# Patient Record
Sex: Female | Born: 1955 | Race: Black or African American | Hispanic: No | Marital: Single | Smoking: Never smoker
Health system: Southern US, Community
[De-identification: ages and names within clinical notes are randomized; demographics above are authoritative.]

## PROBLEM LIST (undated history)

## (undated) DIAGNOSIS — E119 Type 2 diabetes mellitus without complications: Secondary | ICD-10-CM

## (undated) DIAGNOSIS — I639 Cerebral infarction, unspecified: Secondary | ICD-10-CM

## (undated) DIAGNOSIS — I10 Essential (primary) hypertension: Secondary | ICD-10-CM

## (undated) HISTORY — PX: LAPAROSCOPIC ROUX-EN-Y GASTRIC BYPASS WITH UPPER ENDOSCOPY AND REMOVAL OF LAP BAND: SHX6505

---

## 2017-12-23 ENCOUNTER — Emergency Department (HOSPITAL_COMMUNITY)
Admission: EM | Admit: 2017-12-23 | Discharge: 2017-12-23 | Disposition: A | Discharge status: Home / Self Care | Payer: Medicare Other | Attending: Emergency Medicine | Admitting: Emergency Medicine

## 2017-12-23 ENCOUNTER — Emergency Department (HOSPITAL_COMMUNITY): Payer: Medicare Other

## 2017-12-23 ENCOUNTER — Other Ambulatory Visit: Payer: Self-pay

## 2017-12-23 ENCOUNTER — Encounter (HOSPITAL_COMMUNITY): Payer: Self-pay | Admitting: Emergency Medicine

## 2017-12-23 DIAGNOSIS — R531 Weakness: Secondary | ICD-10-CM

## 2017-12-23 DIAGNOSIS — E119 Type 2 diabetes mellitus without complications: Secondary | ICD-10-CM | POA: Diagnosis not present

## 2017-12-23 DIAGNOSIS — R630 Anorexia: Secondary | ICD-10-CM | POA: Diagnosis not present

## 2017-12-23 DIAGNOSIS — R1114 Bilious vomiting: Secondary | ICD-10-CM | POA: Insufficient documentation

## 2017-12-23 DIAGNOSIS — R51 Headache: Secondary | ICD-10-CM | POA: Diagnosis not present

## 2017-12-23 DIAGNOSIS — Z8673 Personal history of transient ischemic attack (TIA), and cerebral infarction without residual deficits: Secondary | ICD-10-CM | POA: Diagnosis not present

## 2017-12-23 DIAGNOSIS — I10 Essential (primary) hypertension: Secondary | ICD-10-CM | POA: Diagnosis not present

## 2017-12-23 DIAGNOSIS — K219 Gastro-esophageal reflux disease without esophagitis: Secondary | ICD-10-CM | POA: Insufficient documentation

## 2017-12-23 DIAGNOSIS — R410 Disorientation, unspecified: Secondary | ICD-10-CM | POA: Diagnosis not present

## 2017-12-23 HISTORY — DX: Cerebral infarction, unspecified: I63.9

## 2017-12-23 HISTORY — DX: Essential (primary) hypertension: I10

## 2017-12-23 HISTORY — DX: Type 2 diabetes mellitus without complications: E11.9

## 2017-12-23 LAB — URINALYSIS, ROUTINE W REFLEX MICROSCOPIC
BILIRUBIN URINE: NEGATIVE
Glucose, UA: NEGATIVE mg/dL
Hgb urine dipstick: NEGATIVE
Ketones, ur: NEGATIVE mg/dL
NITRITE: NEGATIVE
PH: 6 (ref 5.0–8.0)
Protein, ur: NEGATIVE mg/dL
SPECIFIC GRAVITY, URINE: 1.02 (ref 1.005–1.030)

## 2017-12-23 LAB — COMPREHENSIVE METABOLIC PANEL
ALBUMIN: 3.6 g/dL (ref 3.5–5.0)
ALT: 10 U/L (ref 0–44)
ANION GAP: 6 (ref 5–15)
AST: 21 U/L (ref 15–41)
Alkaline Phosphatase: 76 U/L (ref 38–126)
BUN: 13 mg/dL (ref 8–23)
CHLORIDE: 109 mmol/L (ref 98–111)
CO2: 24 mmol/L (ref 22–32)
Calcium: 9.4 mg/dL (ref 8.9–10.3)
Creatinine, Ser: 1.08 mg/dL — ABNORMAL HIGH (ref 0.44–1.00)
GFR calc Af Amer: >60 mL/min (ref >60)
GFR calc non Af Amer: 54 mL/min — ABNORMAL LOW (ref >60)
Glucose, Bld: 108 mg/dL — ABNORMAL HIGH (ref 70–99)
POTASSIUM: 3.5 mmol/L (ref 3.5–5.1)
SODIUM: 139 mmol/L (ref 135–145)
Total Bilirubin: 0.8 mg/dL (ref 0.3–1.2)
Total Protein: 6.9 g/dL (ref 6.5–8.1)

## 2017-12-23 LAB — I-STAT CHEM 8, ED
BUN: 13 mg/dL (ref 8–23)
CALCIUM ION: 1.18 mmol/L (ref 1.15–1.40)
CREATININE: 1 mg/dL (ref 0.44–1.00)
Chloride: 106 mmol/L (ref 98–111)
Glucose, Bld: 102 mg/dL — ABNORMAL HIGH (ref 70–99)
HEMATOCRIT: 32 % — AB (ref 36.0–46.0)
Hemoglobin: 10.9 g/dL — ABNORMAL LOW (ref 12.0–15.0)
Potassium: 3.5 mmol/L (ref 3.5–5.1)
SODIUM: 140 mmol/L (ref 135–145)
TCO2: 25 mmol/L (ref 22–32)

## 2017-12-23 LAB — DIFFERENTIAL
Abs Immature Granulocytes: 0.09 10*3/uL — ABNORMAL HIGH (ref 0.00–0.07)
BASOS ABS: 0 10*3/uL (ref 0.0–0.1)
Basophils Relative: 0 %
EOS ABS: 0.1 10*3/uL (ref 0.0–0.5)
EOS PCT: 1 %
IMMATURE GRANULOCYTES: 1 %
Lymphocytes Relative: 4 %
Lymphs Abs: 0.4 10*3/uL — ABNORMAL LOW (ref 0.7–4.0)
Monocytes Absolute: 0.7 10*3/uL (ref 0.1–1.0)
Monocytes Relative: 6 %
Neutro Abs: 10.5 10*3/uL — ABNORMAL HIGH (ref 1.7–7.7)
Neutrophils Relative %: 88 %

## 2017-12-23 LAB — CBC
HCT: 32.5 % — ABNORMAL LOW (ref 36.0–46.0)
Hemoglobin: 9.9 g/dL — ABNORMAL LOW (ref 12.0–15.0)
MCH: 24.7 pg — ABNORMAL LOW (ref 26.0–34.0)
MCHC: 30.5 g/dL (ref 30.0–36.0)
MCV: 81 fL (ref 80.0–100.0)
NRBC: 0 % (ref 0.0–0.2)
PLATELETS: 220 10*3/uL (ref 150–400)
RBC: 4.01 MIL/uL (ref 3.87–5.11)
RDW: 14.5 % (ref 11.5–15.5)
WBC: 11.7 10*3/uL — AB (ref 4.0–10.5)

## 2017-12-23 LAB — CBG MONITORING, ED: GLUCOSE-CAPILLARY: 100 mg/dL — AB (ref 70–99)

## 2017-12-23 LAB — APTT: aPTT: 32 seconds (ref 24–36)

## 2017-12-23 LAB — I-STAT TROPONIN, ED: Troponin i, poc: 0 ng/mL (ref 0.00–0.08)

## 2017-12-23 LAB — PROTIME-INR
INR: 1.09
PROTHROMBIN TIME: 14 s (ref 11.4–15.2)

## 2017-12-23 MED ORDER — FAMOTIDINE 20 MG PO TABS
20.0000 mg | ORAL_TABLET | Freq: Once | ORAL | Status: AC
  Administered 2017-12-23: 20 mg via ORAL
  Filled 2017-12-23: qty 1

## 2017-12-23 MED ORDER — ALUM & MAG HYDROXIDE-SIMETH 200-200-20 MG/5ML PO SUSP
15.0000 mL | Freq: Once | ORAL | Status: AC
  Administered 2017-12-23: 15 mL via ORAL
  Filled 2017-12-23: qty 30

## 2017-12-23 MED ORDER — ONDANSETRON 4 MG PO TBDP: 4.0000 mg | ORAL_TABLET | Freq: Three times a day (TID) | ORAL | 0 refills | Status: AC | PRN

## 2017-12-23 MED ORDER — ONDANSETRON HCL 4 MG/2ML IJ SOLN
4.0000 mg | Freq: Once | INTRAMUSCULAR | Status: AC
  Administered 2017-12-23: 4 mg via INTRAVENOUS
  Filled 2017-12-23: qty 2

## 2017-12-23 MED ORDER — ACETAMINOPHEN 325 MG PO TABS
650.0000 mg | ORAL_TABLET | Freq: Once | ORAL | Status: AC
  Administered 2017-12-23: 650 mg via ORAL
  Filled 2017-12-23: qty 2

## 2017-12-23 MED ORDER — LACTATED RINGERS IV BOLUS
1000.0000 mL | Freq: Once | INTRAVENOUS | Status: AC
  Administered 2017-12-23: 1000 mL via INTRAVENOUS

## 2017-12-23 NOTE — Discharge Instructions (Signed)
Follow up with primary care doctor in 2 days.  Take tylenol as needed for pain. Take zantac as needed for acid reflux.  Take zofran as needed for nausea.  Drink plenty of fluids.  Return to the ED for any worsening or other concerns.

## 2017-12-23 NOTE — ED Provider Notes (Addendum)
[facility] EMERGENCY DEPARTMENT Provider Note   CSN: [id number] Arrival date & time: 12/23/17  1543     History   Chief Complaint Chief Complaint  Patient presents with  . Weakness  . Emesis    HPI Brittney Armstrong is a 62 y.o. female.  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 Patient reports increased weakness and confusion today which prompted ED visit.  Confusion consisted of transient forgetfulness regarding the date otherwise alert and oriented.  On the drive over she reports onset of centrally located HA between eyes that is moderate in intensity.  She denies fevers, neck pain, falls, trauma, anticoagulation use, numbness, weakness, speech changes, facial droop, and vision changes. She also notes burning central CP that feels like prior acid reflux today.  She denies SOB, cough, URI sxs, abdominal pain, diarrhea, and urinary complaints.  Past Medical History:  Diagnosis Date  . Diabetes mellitus without complication (HCC)   . Hypertension   . Stroke [facility])     There are no active problems to display for this patient.   Past Surgical History:  Procedure Laterality Date  . LAPAROSCOPIC ROUX-EN-Y GASTRIC BYPASS WITH UPPER ENDOSCOPY AND REMOVAL OF LAP BAND       OB History   None      Home Medications    Prior to Admission medications   Medication Sig Start Date End Date Taking? Authorizing Provider  ondansetron (ZOFRAN ODT) 4 MG disintegrating tablet Take 1 tablet (4 mg total) by mouth every 8 (eight) hours as needed for nausea or vomiting. 12/23/17   Abelardo Diesel, MD    Family History No family history on file.  Social History Social History   Tobacco Use  . Smoking status: Never Smoker  . Smokeless tobacco: Never Used  Substance Use Topics  . Alcohol use: Not Currently  . Drug  use: Not Currently     Allergies   Patient has no known allergies.   Review of Systems Review of Systems  Constitutional: Positive for activity change, appetite change and fatigue. Negative for chills and fever.  HENT: Negative for congestion and sore throat.   Eyes: Negative for pain and visual disturbance.  Respiratory: Negative for cough and shortness of breath.   Cardiovascular: Positive for chest pain. Negative for palpitations.  Gastrointestinal: Positive for nausea and vomiting. Negative for abdominal pain, blood in stool and diarrhea.  Genitourinary: Negative for dysuria and hematuria.  Musculoskeletal: Positive for myalgias. Negative for back pain and neck pain.  Skin: Negative for color change, rash and wound.  Neurological: Positive for headaches. Negative for seizures, syncope, light-headedness and numbness.  All other systems reviewed and are negative.    Physical Exam Updated Vital Signs BP 131/85   Pulse 73   Temp 97.8 F (36.6 C) (Oral)   Resp 16   Ht 5\' 5"  (1.651 m)   Wt 65.3 kg   SpO2 99%   BMI 23.96 kg/m   Physical Exam  Constitutional: She is oriented to person, place, and time. She appears well-developed and well-nourished.  Appears older than stated age.  HENT:  Head: Normocephalic and atraumatic.  Dry mucous membranes. Oropharynx clear.  Eyes: Pupils are equal, round, and reactive to light. Conjunctivae and EOM are normal.  Neck: Normal range of motion. Neck supple.  Cardiovascular: Normal rate, regular rhythm and  intact distal pulses.  No murmur heard. Pulmonary/Chest: Effort normal and breath sounds normal. No respiratory distress. She has no wheezes. She has no rales.  Abdominal: Soft. She exhibits no distension. There is no tenderness. There is no guarding.  Musculoskeletal: She exhibits no edema.  Neurological: She is alert and oriented to person, place, and time. She has normal strength. No cranial nerve deficit or sensory deficit.  Coordination normal. GCS eye subscore is 4. GCS verbal subscore is 5. GCS motor subscore is 6.  Skin: Skin is warm and dry. Capillary refill takes less than 2 seconds. No rash noted.  Psychiatric: She has a normal mood and affect.  Nursing note and vitals reviewed.    ED Treatments / Results  Labs (all labs ordered are listed, but only abnormal results are displayed) Labs Reviewed  CBC - Abnormal; Notable for the following components:      Result Value   WBC 11.7 (*)    Hemoglobin 9.9 (*)    HCT 32.5 (*)    MCH 24.7 (*)    All other components within normal limits  DIFFERENTIAL - Abnormal; Notable for the following components:   Neutro Abs 10.5 (*)    Lymphs Abs 0.4 (*)    Abs Immature Granulocytes 0.09 (*)    All other components within normal limits  COMPREHENSIVE METABOLIC PANEL - Abnormal; Notable for the following components:   Glucose, Bld 108 (*)    Creatinine, Ser 1.08 (*)    GFR calc non Af Amer 54 (*)    All other components within normal limits  URINALYSIS, ROUTINE W REFLEX MICROSCOPIC - Abnormal; Notable for the following components:   APPearance HAZY (*)    Leukocytes, UA TRACE (*)    Bacteria, UA RARE (*)    All other components within normal limits  CBG MONITORING, ED - Abnormal; Notable for the following components:   Glucose-Capillary 100 (*)    All other components within normal limits  I-STAT CHEM 8, ED - Abnormal; Notable for the following components:   Glucose, Bld 102 (*)    Hemoglobin 10.9 (*)    HCT 32.0 (*)    All other components within normal limits  URINE CULTURE  PROTIME-INR  APTT  I-STAT TROPONIN, ED    EKG EKG Interpretation  Date/Time:  Saturday December 23 2017 16:03:29 EDT Ventricular Rate:  71 PR Interval:    QRS Duration: 101 QT Interval:  427 QTC Calculation: 464 R Axis:   60 Text Interpretation:  Normal sinus rhythm EARLY R WAVE PROGRESSION Confirmed by Margarita Grizzle [phone]) on 12/23/2017 9:29:22 PM   Radiology Dg Chest  2 View  Result Date: 12/23/2017 CLINICAL DATA:  Right arm weakness, slurred speech and vomiting. EXAM: CHEST - 2 VIEW COMPARISON:  None. FINDINGS: Enlarged cardiac silhouette.  Mediastinal contours appear intact. There is no evidence of focal airspace consolidation, pleural effusion or pneumothorax. Osseous structures are without acute abnormality. Soft tissues are grossly normal. IMPRESSION: Enlarged cardiac silhouette. Otherwise no acute abnormalities within the chest radiographically. Electronically Signed   By: Ted Mcalpine M.D.   On: 12/23/2017 17:35   Ct Head Wo Contrast  Result Date: 12/23/2017 CLINICAL DATA:  Right arm weakness and slurred speech with difficulty walking around 0700 hours. EXAM: CT HEAD WITHOUT CONTRAST TECHNIQUE: Contiguous axial images were obtained from the base of the skull through the vertex without intravenous contrast. COMPARISON:  None. FINDINGS: Brain: No intra-axial mass nor extra-axial fluid. No large vascular territory infarct. No acute intracranial  hemorrhage. Mild involutional changes of the brain likely age related. 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 Vascular: No hyperdense vessel sign. Mild atherosclerosis of the carotid siphons. Skull: Intact Sinuses/Orbits: No acute finding. 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 Atherosclerosis of both carotid siphons. Electronically Signed   By: Tollie Eth M.D.   On: 12/23/2017 17:58    Procedures Procedures (including critical care time)  Medications Ordered in ED Medications  ondansetron (ZOFRAN) injection 4 mg (4 mg Intravenous Given 12/23/17 1643)  acetaminophen (TYLENOL) tablet 650 mg (650 mg Oral Given 12/23/17 1642)  lactated ringers bolus 1,000 mL (0 mLs Intravenous Stopped  12/23/17 1834)  alum & mag hydroxide-simeth (MAALOX/MYLANTA) 200-200-20 MG/5ML suspension 15 mL (15 mLs Oral Given 12/23/17 1936)  famotidine (PEPCID) tablet 20 mg (20 mg Oral Given 12/23/17 1936)     Initial Impression / Assessment and Plan / ED Course  I have reviewed the triage vital signs and the nursing notes.  Pertinent labs & imaging results that were available during my care of the patient were reviewed by me and considered in my medical decision making (see chart for details).     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  Transient episode of confusion otherwise no focal weakness, numbness, or vision changes.  Patient also with acid reflux and HA.  On arrival she is HDS.  Exam as above grossly unremarkable.  EKG shows NSR with a rate of 71 and no evidence of acute ischemic changes, abnormal intervals, or dysrhythmia. No prior for comparison.  Etiology likely 2/2 viral illness vs dehydration.  No infectious, metabolic, or toxic source identified.  UA without infection and CXR without pneumonia.  Abdomen benign.  Labs unremarkable.  CT head without acute intracranial abnormality.  Doubt acute ACS as chest pain is atypical, EKG without acute ischemia, and troponin negative.  Burning likely 2/2 esophagitis from vomiting.  Doubt CVA or TIA as patient with transient confusion with the year.  Doubt dissection, meningitis, pericarditis, or other emergent process at this time as this would be inconsistent with history and physical, other diagnosis much more likely.  She is currently headache and chest pain-free.  She had significant improvement with GI cocktail, IVF bolus, Zofran, and Tylenol.  Patient tolerated PO and ambulated without difficulty.  She is currently asymptomatic and requesting to go home.    Old records reviewed.  Imaging and labs reviewed and interpreted by myself and attending and used  in the MDM.  Addressed patient question and concerns.  Reviewed discharged instructions with strict precautions given.  Advised patient to schedule follow-up with primary care provider.  Patient verbalized understanding and agrees with plan.  Patient stable at discharge.  The plan for this patient was discussed with Dr. Rosalia Hammers who voiced agreement and who oversaw evaluation and treatment of this patient.   Final Clinical Impressions(s) / ED Diagnoses   Final diagnoses:  Generalized weakness  Gastroesophageal reflux disease, esophagitis presence not specified  Bilious vomiting with nausea    ED Discharge Orders         Ordered    ondansetron (ZOFRAN ODT) 4 MG disintegrating tablet  Every 8 hours PRN     12/23/17 2135  Abelardo Diesel, MD 12/24/17 Geronimo Boot    Abelardo Diesel, MD 12/24/17 1610    Margarita Grizzle, MD 12/24/17 [phone]

## 2017-12-23 NOTE — ED Triage Notes (Signed)
Per [facility] pt coming from her daughters house, pt reports vomiting yesterday. States 7am today having right arm weakness, slurred speech and difficulty walking that resolved. Pt states she is diabetic but does not have a way to chest her sugars and PCP took her off any medications for diabetes.

## 2017-12-23 NOTE — ED Notes (Signed)
Pt was able to ambulate to bathroom with steady gait

## 2017-12-23 NOTE — ED Notes (Signed)
Patient verbalizes understanding of discharge instructions. Opportunity for questioning and answers were provided. Armband removed by staff, pt discharged from ED ambulatory w/ family  

## 2017-12-25 LAB — URINE CULTURE: CULTURE: NO GROWTH

## 2019-08-30 IMAGING — CT CT HEAD W/O CM
4 series · 16 of 47 positions shown, 18 images · non-contrast
Comparison: None.

CLINICAL DATA: Right arm weakness and slurred speech with
difficulty walking around 2422 hours.

EXAM:
CT HEAD WITHOUT CONTRAST
TECHNIQUE: Contiguous axial images were obtained from the base of the skull
through the vertex without intravenous contrast.

[Series 3: head wo · axial · 0.42mm/px · z∈[-124,-10]mm · 7 of 31 slices shown, 9 images]
[im 4/31  brain]
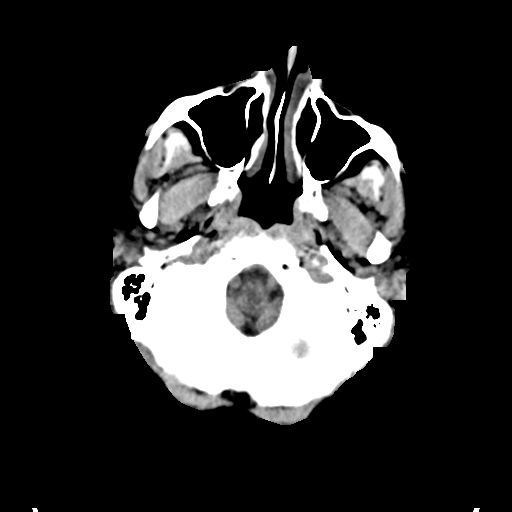
[im 4/31  bone]
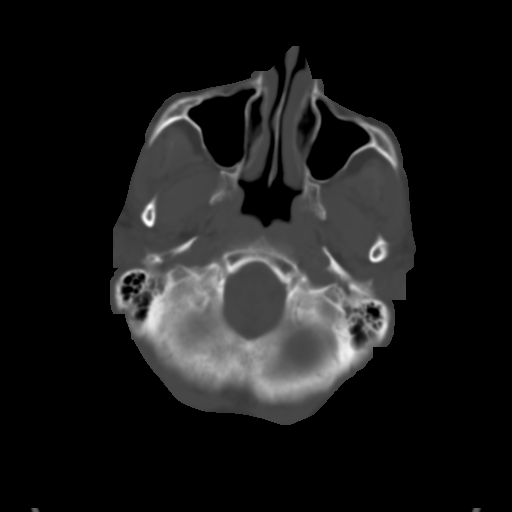
[im 8/31  brain]
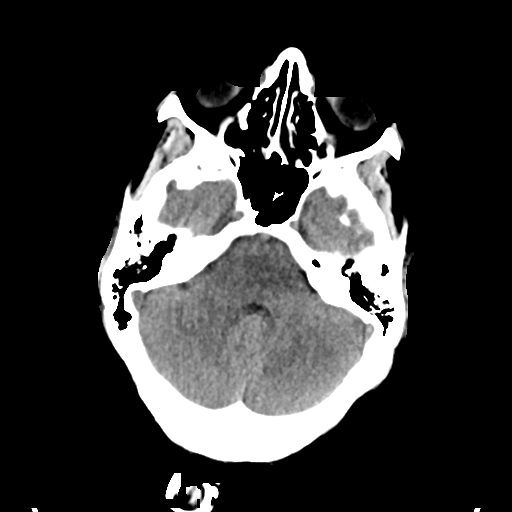
[im 12/31  brain]
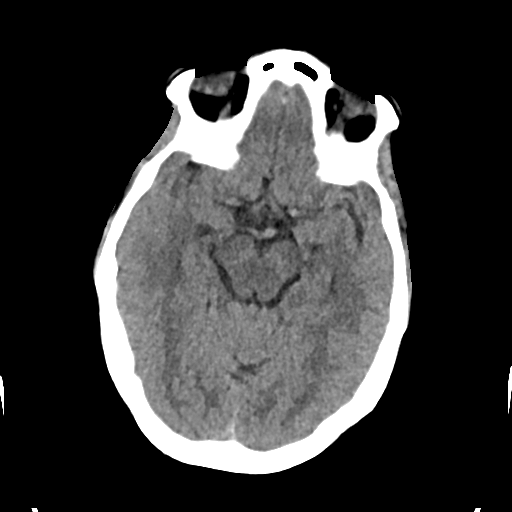
[im 16/31  brain]
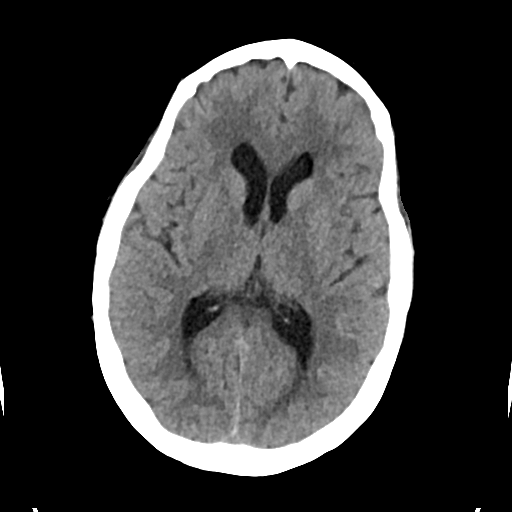
[im 19/31  brain]
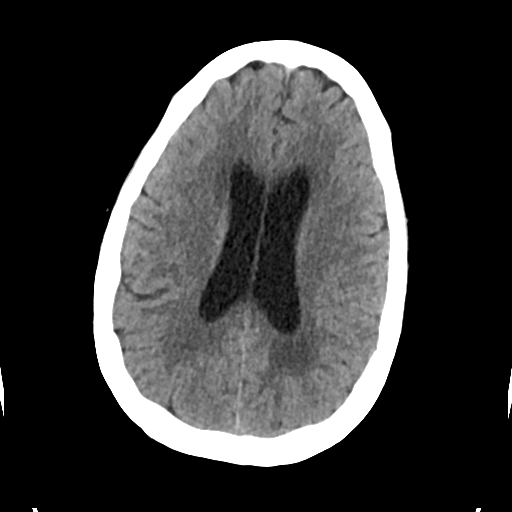
[im 19/31  bone]
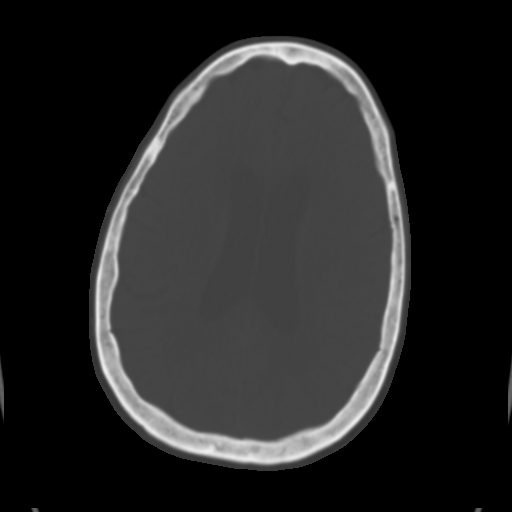
[im 23/31  brain]
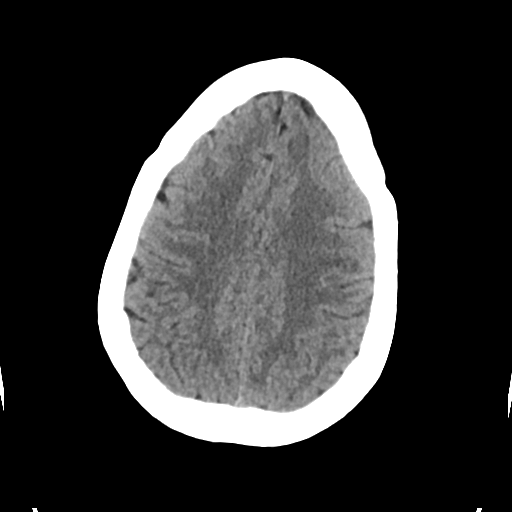
[im 27/31  brain]
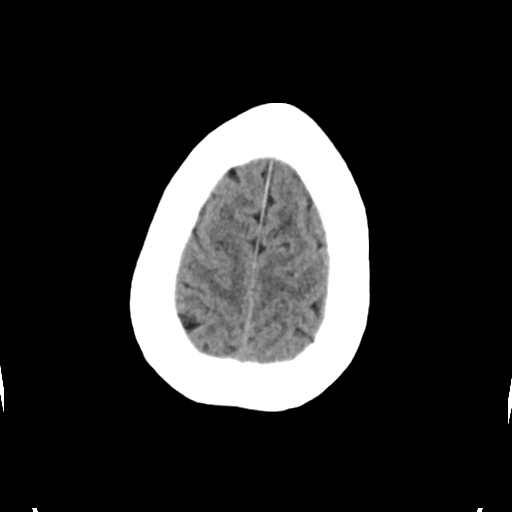

[Series 4: head bone · axial · 0.42mm/px · z∈[-126,-94]mm · 3 of 78 slices shown]
[im 8/78  bone]
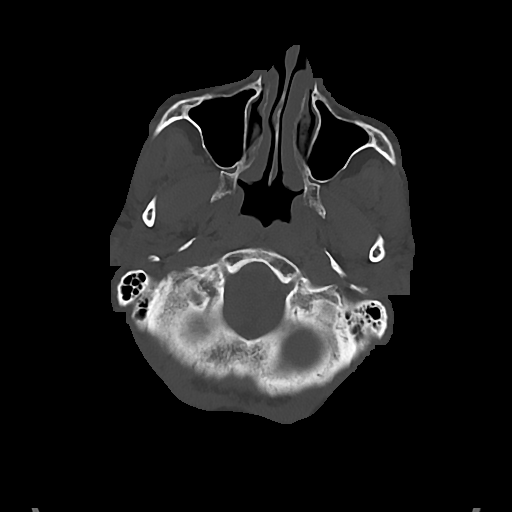
[im 16/78  bone]
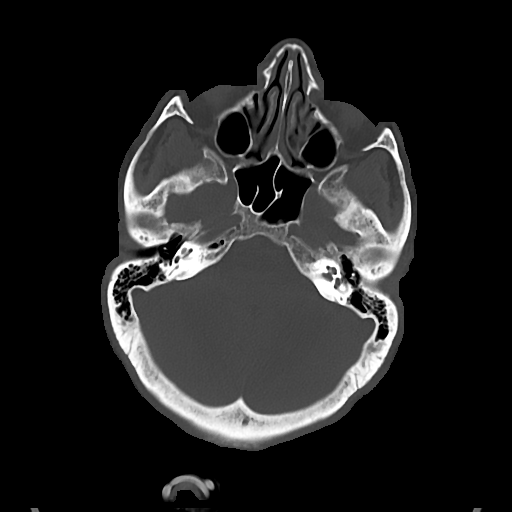
[im 24/78  bone]
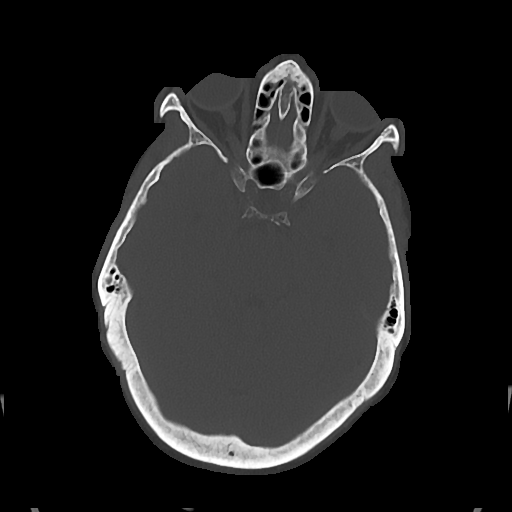

[Series 5: cor soft · coronal · 0.33mm/px · 3 of 64 slices shown]
[im 22/64  brain]
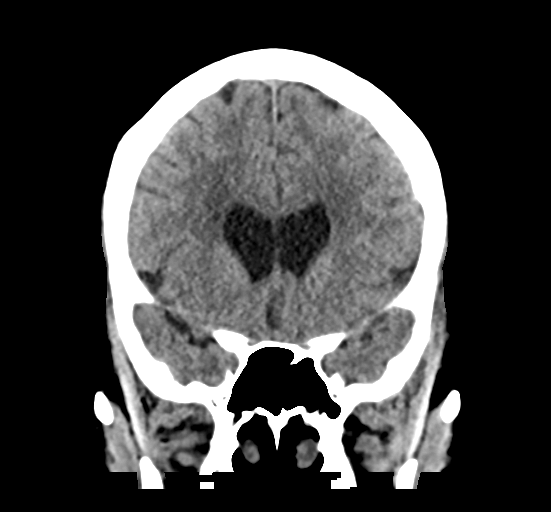
[im 29/64  brain]
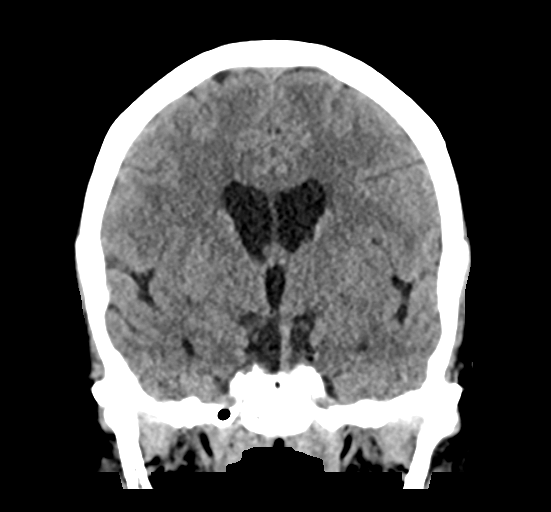
[im 36/64  brain]
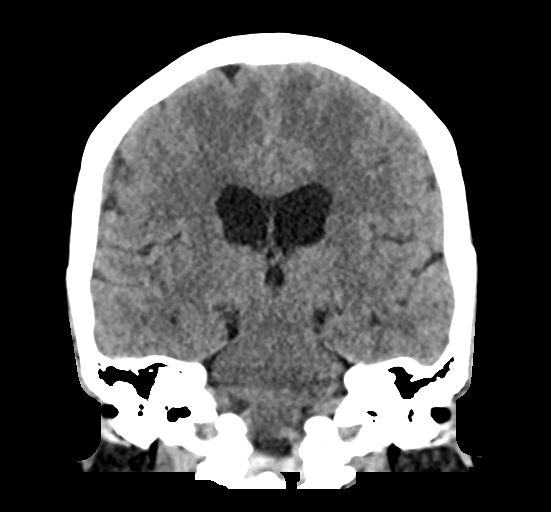

[Series 6: sag soft · sagittal · 0.33mm/px · 3 of 45 slices shown]
[im 15/45  brain]
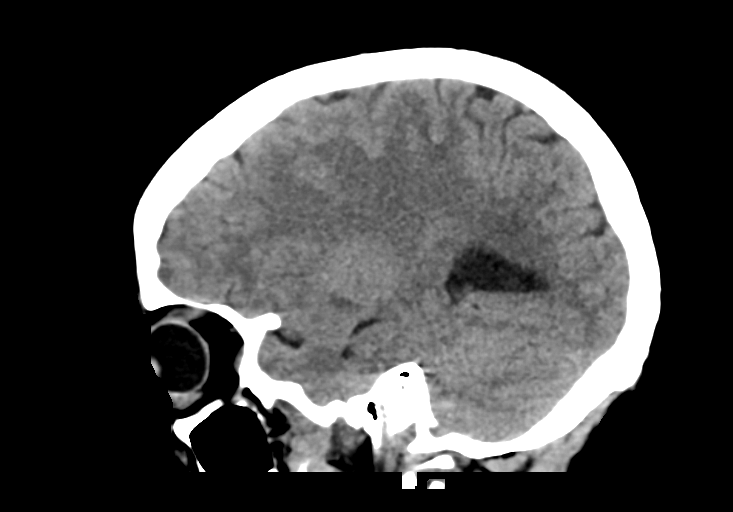
[im 23/45  brain]
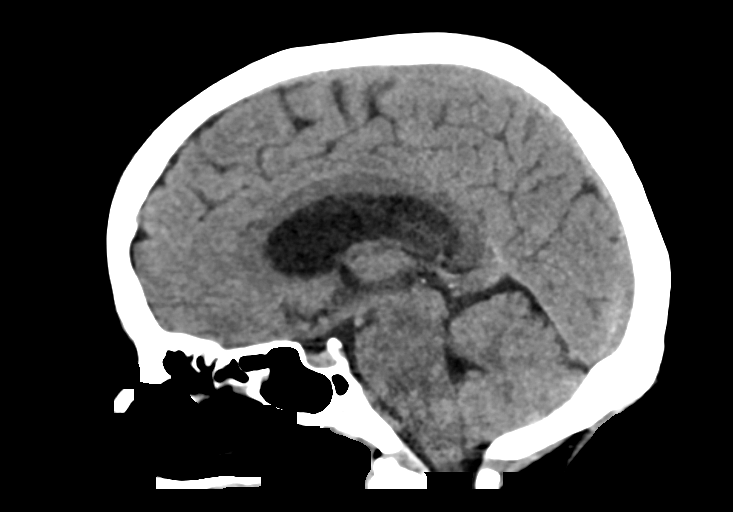
[im 30/45  brain]
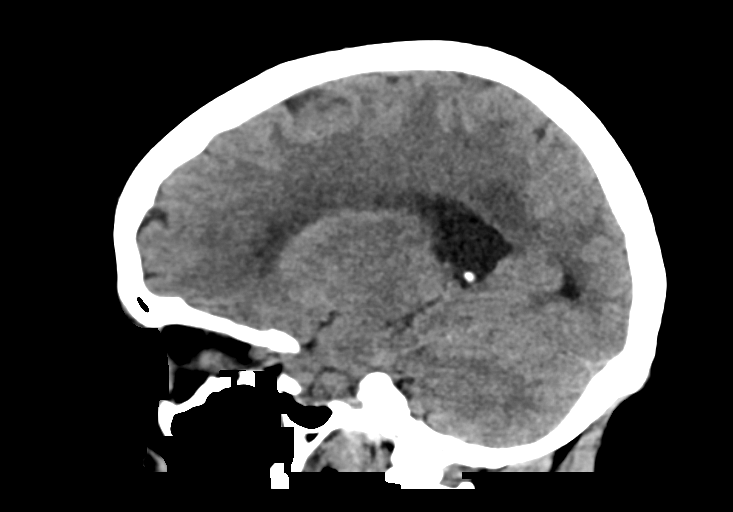

[16 of 47 positions shown; findings below may reference images not displayed]

FINDINGS: Brain: No intra-axial mass nor extra-axial fluid. No large vascular
territory infarct. No acute intracranial hemorrhage. Mild
involutional changes of the brain likely age related. Small vessel
ischemic disease of periventricular white matter is identified, age
indeterminate in the absence of prior studies, more notably adjacent
to the left posterior horn of the lateral ventricle..

Vascular: No hyperdense vessel sign. Mild atherosclerosis of the
carotid siphons.

Skull: Intact

Sinuses/Orbits: No acute finding.

Other: None
IMPRESSION: Age-indeterminate but likely chronic periventricular white matter
microangiopathy.

Atherosclerosis of both carotid siphons.
